# Patient Record
Sex: Male | Born: 1975 | Race: White | Hispanic: No | Marital: Married | Smoking: Current every day smoker
Health system: Southern US, Community
[De-identification: ages and names within clinical notes are randomized; demographics above are authoritative.]

---

## 2018-02-09 ENCOUNTER — Emergency Department (HOSPITAL_COMMUNITY): Payer: Self-pay

## 2018-02-09 ENCOUNTER — Emergency Department (HOSPITAL_COMMUNITY)
Admission: EM | Admit: 2018-02-09 | Discharge: 2018-02-09 | Disposition: A | Discharge status: Home / Self Care | Payer: Self-pay | Attending: Emergency Medicine | Admitting: Emergency Medicine

## 2018-02-09 ENCOUNTER — Encounter (HOSPITAL_COMMUNITY): Payer: Self-pay | Admitting: *Deleted

## 2018-02-09 DIAGNOSIS — F17210 Nicotine dependence, cigarettes, uncomplicated: Secondary | ICD-10-CM | POA: Insufficient documentation

## 2018-02-09 DIAGNOSIS — T148XXA Other injury of unspecified body region, initial encounter: Secondary | ICD-10-CM

## 2018-02-09 DIAGNOSIS — Y939 Activity, unspecified: Secondary | ICD-10-CM | POA: Insufficient documentation

## 2018-02-09 DIAGNOSIS — Z23 Encounter for immunization: Secondary | ICD-10-CM | POA: Insufficient documentation

## 2018-02-09 DIAGNOSIS — Y999 Unspecified external cause status: Secondary | ICD-10-CM | POA: Insufficient documentation

## 2018-02-09 DIAGNOSIS — S81831A Puncture wound without foreign body, right lower leg, initial encounter: Secondary | ICD-10-CM | POA: Insufficient documentation

## 2018-02-09 DIAGNOSIS — Y929 Unspecified place or not applicable: Secondary | ICD-10-CM | POA: Insufficient documentation

## 2018-02-09 MED ORDER — TETANUS-DIPHTH-ACELL PERTUSSIS 5-2.5-18.5 LF-MCG/0.5 IM SUSP
0.5000 mL | Freq: Once | INTRAMUSCULAR | Status: AC
  Administered 2018-02-09: 0.5 mL via INTRAMUSCULAR
  Filled 2018-02-09: qty 0.5

## 2018-02-09 MED ORDER — LIDOCAINE-EPINEPHRINE (PF) 2 %-1:200000 IJ SOLN
20.0000 mL | Freq: Once | INTRAMUSCULAR | Status: AC
  Administered 2018-02-09: 20 mL
  Filled 2018-02-09: qty 20

## 2018-02-09 MED ORDER — CYCLOBENZAPRINE HCL 5 MG PO TABS: 5.0000 mg | ORAL_TABLET | Freq: Every evening | ORAL | 0 refills | Status: AC | PRN

## 2018-02-09 MED ORDER — NAPROXEN 500 MG PO TABS: 500.0000 mg | ORAL_TABLET | Freq: Two times a day (BID) | ORAL | 0 refills | Status: AC
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
## 2018-02-09 NOTE — Discharge Instructions (Signed)
1. Medications: naproxen twice a day for pain and swelling. Flexeril as needed 2. Treatment: ice for swelling, keep wound clean with warm soap and water and keep bandage dry, do not submerge in water 3. Follow Up: Please return in 7-10 days to have your stitches removed or sooner if you have concerns. Return to the emergency department for increased redness, drainage of pus from the wound   WOUND CARE  Keep area clean and dry overnight. Do not remove bandage, if applied.  tomorrow, remove bandage and wash wound gently with mild soap and warm water. Reapply a new bandage after cleaning wound, if directed.   Continue daily cleansing with soap and water until stitches are removed.  Do not apply any ointments or creams to the wound while stitches are in place, as this may cause delayed healing. Return if you experience any of the following signs of infection: Swelling, redness, pus drainage, streaking, fever >101.0 F  Return if you experience excessive bleeding that does not stop after 15-20 minutes of constant, firm pressure.
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
